# Patient Record
Sex: Female | Born: 1935 | Hispanic: No
Health system: Midwestern US, Academic
[De-identification: ages and names within clinical notes are randomized; demographics above are authoritative.]

---

## 2022-02-06 ENCOUNTER — Encounter: Admit: 2022-02-06 | Discharge: 2022-02-06 | Payer: MEDICARE

## 2022-02-07 ENCOUNTER — Encounter: Admit: 2022-02-07 | Discharge: 2022-02-07 | Payer: MEDICARE

## 2022-02-07 DIAGNOSIS — C50912 Malignant neoplasm of unspecified site of left female breast: Secondary | ICD-10-CM

## 2022-02-13 ENCOUNTER — Encounter: Admit: 2022-02-13 | Discharge: 2022-02-13 | Payer: MEDICARE

## 2022-02-13 DIAGNOSIS — C50912 Malignant neoplasm of unspecified site of left female breast: Secondary | ICD-10-CM

## 2022-02-13 DIAGNOSIS — C50919 Malignant neoplasm of unspecified site of unspecified female breast: Secondary | ICD-10-CM

## 2022-02-15 ENCOUNTER — Encounter: Admit: 2022-02-15 | Discharge: 2022-02-15 | Payer: MEDICARE

## 2022-02-15 DIAGNOSIS — C50919 Malignant neoplasm of unspecified site of unspecified female breast: Secondary | ICD-10-CM

## 2022-02-15 DIAGNOSIS — M1990 Unspecified osteoarthritis, unspecified site: Secondary | ICD-10-CM

## 2022-02-15 DIAGNOSIS — C50212 Malignant neoplasm of upper-inner quadrant of left female breast: Secondary | ICD-10-CM

## 2022-02-15 DIAGNOSIS — I4891 Unspecified atrial fibrillation: Secondary | ICD-10-CM

## 2022-02-15 DIAGNOSIS — I519 Heart disease, unspecified: Secondary | ICD-10-CM

## 2022-02-15 DIAGNOSIS — M549 Dorsalgia, unspecified: Secondary | ICD-10-CM

## 2022-02-15 DIAGNOSIS — Z9189 Other specified personal risk factors, not elsewhere classified: Secondary | ICD-10-CM

## 2022-02-15 NOTE — Progress Notes
Spoke with patient regarding surgery plan pending and confirmed surgery date of 5/18 with Dr. Earleen Newport. Consent was not obtained at this time. The patient was informed that they would receive a call from the surgery department the day prior to surgery to confirm time of arrival.  The patient was given detailed instructions about where to check in the day of surgery.     Paperwork explaining pre-operative instructions was given to the patient and reviewed in detail. Informed patient that we will see them post operatively 1-2 weeks after date of surgery.    The patient has been educated of NPO requirements starting at midnight the night before surgery. Also informed that a driver will need to accompany patient due to the influence of anesthesia including narcotics post procedure. Informed patient that the Anesthesia department will call to discuss lab work, medication review (will discuss medications that need to be stopped 7-10 days prior to surgery), health history, anesthesia/ surgical history, and any other additional recommended testing for clearance for surgery.      The patient verbalized understanding of the information given and was provided a phone number to call with any questions or concerns.      Shireen Quan, RN

## 2022-02-15 NOTE — Progress Notes
Lymphedema Prevention Bioimpedance Spectroscopy (BIS) Monitoring    BIS obtained for baseline measurements prior to surgery.    Baseline BIS = -3.8, Left  No notification indicated     Dominant hand: right handed  BMI: 24.06    A BIS test (Bioimpedance Spectroscopy test) was done in coordination with your breast surgery appointment today for baseline lymphedema evaluation only. You are not at risk for developing lymphedema until after surgery. The BIS test is a scale used to help detect early signs of lymphedema. Your baseline measurement from today will be used to aid with lymphedema monitoring in the future. Our clinic will meet with you for lymphedema education post-operatively and will explain this test in greater detail at that time.

## 2022-02-15 NOTE — Progress Notes
Name: Misty Clay          MRN: [id number]      DOB: 11-22-1932      AGE: 88 y.o.   DATE OF SERVICE: 02/15/2022                Reason for Visit:  New Patient      Misty Clay is a 88 y.o. female.      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 Misty Clay had no complaints prior to her outside screening mammogram on 12/25/21. At that time, there was a new left breast density. She returned for left diagnostic mammogram and ultrasound on 01/04/22. The mass persisted and was mildly suspicious on ultrasound and biopsy was recommended. Left breast sono-guided biopsy 01/24/22 ([place]) revealed grade 2 invasive ductal carcinoma.  She denies breast pain, nipple retraction, nipple discharge, or skin changes.    BREAST IMAGING:  Mammogram:    -- Bilateral screening mammogram 12/25/21 [facility]) revealed heterogeneously dense breast tissue. There is a new density in the medial aspect of the left breast best seen on the CC view. This appears to be just below the nipple line on the MLO view. Additional views are recommended. Right breast is unremarkable. There are scattered benign calcifications.  -- Left diagnostic mammogram 01/04/22 ([place]) revealed compression and rolled views of this area show that this finding still seems to persist. This area is difficult to identify on the true lateral view. It is unclear whether this finding is related to superimposition or to an underlying abnormality. I would recommend ultrasound.  -- Bilateral diagnostic mammogram 02/15/22 ([place]) revealed scattered areas of fibroglandular density. ?History: 88 year old female with recently diagnosed left breast malignancy at an external facility. 2-D and 3-D images of the right breast were performed, including 3-D spot compression views. Spot magnification views were obtained of the left breast. Right: The questioned asymmetry in the central posterior right breast on CC view does not persist on additional views. Left: There are loosely grouped faint and coarse heterogeneous calcifications in the upper outer quadrant at 2:00, far posterior depth, measuring up to 4 cm.     Ultrasound:    -- Targeted left breast ultrasound 01/04/22 ([place]) revealed at 9:00, 5 cm FTN, there is a 5 x 2 x 3 mm avascular hypoechoic area. This hypoechoic area is surrounded by an isoechoic rind which makes the entire region apprxoimately 1 cm in size. I am suspicious that this does correspond to the finding of the mammogram. There does not appear to be any significant vascularity associated with this finding that would suggest it is an aggressive neoplastic process. Even so, the possibility that this is neoplastic in nature should still be considered. Ultrasound biopsy recommended. BI-RADS 4.  -- Targeted left breast ultrasound 02/15/22 ([place]) revealed a 1.0 x 0.8 x 1.3 cm irregular hypoechoic mass with indistinct margins in the left breast at the 9:00, 5 cm from  the nipple consistent with biopsy-proven malignancy. Tissue marker clip is at the deep/posterior aspect. No morphologically abnormal left internal mammary lymph nodes are identified. 5 morphologically normal-appearing left axillary lymph nodes are documented. Impression: 1. Biopsy-proven malignancy in the left breast at 9:00 measuring up to 1.3 cm. 2. Mildly suspicious calcifications in the upper outer left breast. If breast conservation therapy is desired, stereotactic guided biopsy would be recommended. REPRODUCTIVE HEALTH:  Age at first Menarche:  89  Age at First Live Birth:  53  Age at Menopause:  S/p hysterectomy at 60, took HRT until 75s  Gravida:  2  Para: 2    PROCEDURE: pending  PERTINENT PMH:  Atrial fibrillation, HTN  FAMILY HISTORY:  No known family history of breast, ovarian, prostate or pancreatic cancer  PHYSICAL EXAM on PRESENTATION:    MEDICAL ONCOLOGY:   Dr. Lysle Morales   REFERRED BY:  Dr. Lysle Morales       Review of Systems    Constitutional: Negative for fever, chills. appetite change and fatigue.   HENT: Negative for hearing loss, congestion, rhinorrhea and tinnitus.    Eyes: Negative for pain, discharge and itching.   Respiratory: Negative for cough, chest tightness and shortness of breath.    Cardiovascular: Negative for chest pain and palpitations.   Gastrointestinal: Negative for abdominal distention, pain, nausea, vomiting, and diarrhea.   Genitourinary: Negative for frequency, vaginal bleeding, difficulty urinating and pelvic pain.   Musculoskeletal: Negative for myalgias, back pain, joint swelling and arthralgias.   Skin: Negative for rash.   Neurological: Negative for dizziness, weakness, light-headedness and headaches.   Hematological: Does not bruise/bleed easily.   Psychiatric/Behavioral: Negative for disturbed wake/sleep cycle. The patient is not nervous/anxious. confusion    No Known Allergies    Medical History:   Diagnosis Date   ? Arthritis    ? Atrial fibrillation (HCC)    ? Back pain    ? Breast cancer (HCC) 01/2022   ? Heart disease      Surgical History:   Procedure Laterality Date   ? ANGIOPLASTY     ? BREAST BIOPSY     ? CATARACT REMOVAL     ? COLONOSCOPY     ? HX HYSTERECTOMY       Family History   Problem Relation Age of Onset   ? Cancer Mother    ? Heart Disease Father    ? Heart Disease Maternal Aunt      Social History     Socioeconomic History   ? Marital status: Widowed   Tobacco Use   ? Smoking status: Former     Packs/day: 0.50     Years: 15.00     Pack years: 7.50 Types: Cigarettes     Quit date: 1973     Years since quitting: 50.3   ? Smokeless tobacco: Never   Substance and Sexual Activity   ? Alcohol use: Not Currently   ? Drug use: Never                   Objective:         ? acetaminophen (TYLENOL PO) Take  by mouth.   ? calcium carbonate/vitamin D3 (CALTRATE 600 + D PO) Take  by mouth.   ? famotidine (PEPCID) 20 mg tablet Take one tablet by mouth daily.   ? FEROSUL 325 mg (65 mg iron) tablet Take one tablet by mouth daily.   ? Gluc-Chon-MSM#1-C-Mang-Bos-Bor 750-625-30 mg tab Take 2 tablets by mouth daily.   ?  hydroCHLOROthiazide 12.5 mg capsule Take one capsule by mouth daily.   ? metoprolol succinate XL (TOPROL XL) 50 mg extended release tablet Take one tablet by mouth twice daily.   ? XARELTO 20 mg tablet Take one tablet by mouth daily.     Vitals:    02/15/22 1038 02/15/22 1039   BP:  (!) 164/70   BP Source:  Arm, Left Upper   Pulse:  55   Temp:  36.2 ?C (97.1 ?F)   Resp:  18   SpO2:  99%   O2 Device:  None (Room air)   TempSrc:  Temporal   PainSc: Three Three   Weight:  62.6 kg (138 lb)   Height:  161.3 cm (5' 3.5)     Body mass index is 24.06 kg/m?Marland Kitchen     Pain Score: Three  Pain Loc: Knee    Fatigue Scale: 8    Pain Addressed:  N/A    Patient Evaluated for a Clinical Trial: No treatment clinical trial available for this patient.     [place] Cooperative Oncology Group performance status is 1, Restricted in physically strenuous activity but ambulatory and able to carry out work of a light or sedentary nature, e.g., light house work, office work.     Physical Exam  Vitals reviewed.     RIGHT BREAST EXAM:  Breast: No discretely palpable masses, no nipple discharge, no nipple inversion, no skin changes  Skin Erythema:  No  Attachment of Overlying Skin:  No  Peau d' orange:  No  Chest Wall Attachment:  No  Nipple Inversion:  No  Nipple Discharge: No    LEFT BREAST EXAM:  Breast: Oval mass at 9:00 most likely post biopsy hematoma measuring 2.5 cm 3 cm from the border of the areola, 3 cm area of skin ecchymosis lying 1 cm below the palpable hematoma, skin pulling diagonally just below the hematoma in the seated flexed position.  No nipple changes.  No skin erythema.  Skin Erythema:  No  Attachment of Overlying Skin:  No  Peau d' orange:  No  Chest Wall Attachment: No  Nipple Inversion:  No  Nipple Discharge:  No    RIGHT NODAL BASIN EXAM:  Axillary:  negative  Infraclavicular:  negative  Supraclavicular:  negative    LEFT NODAL BASIN EXAM:  Axillary:  negative  Infraclavicular: negative  Supraclavicular:  negative      Constitutional: Well-developed and well-nourished. No acute distress.  HEENT:  Head: Normocephalic and atraumatic.  Eyes: Pupils are equal, round and reactive to light. No discharge. No scleral icterus.  Pulmonary/Chest: Effort normal. No respiratory distress.   Musculoskeletal: Normal range of motion. No edema.  Neurological: Alert and oriented to person, place and time. No cranial nerve deficit. has some memory loss  Skin: Warm and dry. No rash noted. No erythema. No pallor.  Psychiatric: Normal mood and affect. Behavior is normal. Judgement and thought content normal.            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 We discussed the need for a multidisciplinary approach to breast cancer treatment with local and systemic therapy. We viewed her breast imaging and results in detail.   The area of known malignancy measures 1.3 cm in greatest dimension on ultrasound today.  5 normal-appearing lymph nodes were also identified.  Additionally, faint calcifications were identified at 2:00 in the left breast measuring a total area of 4 cm.  We discussed the need to perform a biopsy of the calcifications to determine if this is a second site of malignancy.  She expressed her understanding and is ready to proceed with the biopsy.  We then discussed the sequence of treatment for local and systemic therapy.  Based on the early stage and tumor biology would recommend proceeding with surgery first.  This will allow [place] to obtain additional information for finalizing the systemic therapy and radiation therapy plans.    We then discussed local treatment options in detail. Discussed options of BCT versus mastectomy. Similar risk of local/regional recurrence with BCT and mastectomy, with no survival benefit for mastectomy.  If the biopsy is negative for malignancy she is an excellent candidate for lumpectomy which would be her preference.  However, if the second area is malignancy we will then need to further explore the appropriateness of 2 site lumpectomy based on the final pathology versus needing to proceed with total mastectomy. Lumpectomy was discussed in detail including the use of local tissue rearrangement to improve cosmesis and reduce seroma formation.  Mastectomy with and without reconstruction was also discussed to include paresthesia, removal of the nipple areolar complex, and reconstruction as a phased process.  We also reviewed in terms of reconstruction the need for multiple surgeries and the impact of anesthesia on potentially worsening dementia.  If she needs mastectomy she is not interested in reconstruction and would prefer to proceed with total mastectomy.    We then discussed axillary nodal management. We also discussed the need for SLNB to evaluate for nodal metastasis and and pathologic staging. We discussed the risk of lymphedema associated with nodal surgery.  A baseline measurement with the BIS was obtained today.  She will meet with a lymphedema nurse in the Lymphedema Clinic for education.    Misty Clay follows with Dr. Henry Russel for cardiology. She is on Xarelto for atrial fibrillation. We will reach out to his office for cardiac clearance and to discuss stopping Xarelto for surgery.. Both Misty Clay and her daughter would like all communication to go through her daughter. We have updated the telephone number to reflect her daughter's number and messages can be left. Misty Clay is to return to the clinic 1-2 weeks post op. She and her daughter were given ample time to ask questions all of which were answered to their satisfaction.  They expressed their understanding of her conversation and the recommendations outlined above.  We will call them with the results of the biopsy and continue to finalize the surgical plan.  She was encouraged to call with any interval questions or concerns.    Based on the Cures Act all results will be immediately released to MyChart.  We will be completing a thorough review of the additional information and putting it in context with the overall diagnosis.  We will call once this has been completed to discuss the results, as well as the next steps in treatment.       1. Cardiac clearance  2. Continue follow up with Dr. Lysle Morales  3. Left stereotactic biopsy  4. Surgery plan pending biopsy    Guy Begin, PA-C    ATTESTATION    I personally interviewed and examined the patient.  I have reviewed the history outlined by the Physician Assistant.  I personally documented additional history, review of systems, the entire physical exam, and the  assessment and plan above.      Staff name:  Neldon Mc, DO Date:  02/15/2022

## 2022-02-15 NOTE — Progress Notes
Bioimpedance Spectroscopy performed. Advised patient that Normal result will be sent within 24 hours by mail or via Mychart (preferred). The patient will be contacted via phone by the lymphedema nurse with any abnormal results.

## 2022-02-15 NOTE — Patient Instructions
BREAST SURGERY POST-OPERATIVE INSTRUCTIONS    DIET  -You have no dietary restrictions. Please continue with a healthy balanced diet.  -Do not drink alcohol while taking narcotic pain medication.    ACTIVITY  -No driving while taking narcotic pain medication.  -No lifting greater than 10 pounds.   -No repetitive motions with the arm on the side of surgery (ie. laundry, vacuuming, washing windows/floors, etc.)  -Begin the exercises found in your treatment guide 24 hours after surgery.  -You may resume your normal activity once cleared by your surgeon. This can be discussed at your post-operative appointment.    INCISION CARE  -Keep your incision clean and dry.    -You may shower 24 hours following your procedure. It is safe for the incisions to get wet unless otherwise instructed by your surgeon. Pat incisions dry after showering.  -Avoid applying deodorants, powders, creams, lotions, etc. to your incision for 4 weeks.  -Usually, there are no stitches to be removed. You will either have a Dermabond (skin glue) or Therabond (silver covering) dressing over your incision.    *Dermabond is an antimicrobial barrier that will begin to flake after approximately 1 week. Do not peel to remove prior to flaking. Do NOT apply ointments to your incisions over the Dermabond, as these substances can weaken the barrier and allow for skin edge separation.    *Therabond is a silver impregnated dressing that promotes wound healing. This dressing is to stay in place until your surgeon instructs you to remove it.  -Your incision should gradually look better each day.  If you notice unusual swelling, redness, drainage, have increasing pain at the site, or have a fever greater than 100 degrees, notify your physician immediately.  -You may have a pink post-surgery bra and fluffs in place.  This can be removed 24 hours after surgery to shower. Continue to wear the pink bra or a snug sports bra until your post-operative appointment. Do NOT wear underwire bras.    SIGNS & SYMPTOMS  -Please contact your doctor if you have any of the following symptoms: temperature higher than 100 degrees F, uncontrolled pain, persistent nausea and/or vomiting, difficulty breathing or breast redness, leakage or incision breakdown/opening.     IF YOU HAVE A DRAIN  *WASH HANDS PRIOR TO ANY HANDLING OF DRAIN.   -Please write down daily (total for 24 hours) drain output and empty multiple times a day if needed or according to physician's instructions. Record the output in milliliters.  -There is a CHG drain dressing in place over the entry site of your drain. This is to remain in place until removed by your surgeon. There is an antimicrobial gel in the center rectangle of the dressing. This is normal and not a sign of fluid leaking. If there is physical liquid leaking on the skin outside the area of the drain, please notify your surgeons office.   -To empty the drain, open the lid on top of the bulb.  Pour the fluid into a measuring cup and record.  Close by squeezing the bulb until as much air as possible is removed, then cap the lid to recreate suction.   -Strip the drain twice daily or as needed by pinching the tubing near the skin with both hands.  Keep hand closest to you steady and with the other, slowly squeeze the liquid toward the bulb.  This will prevent any clots or debris from clogging the drain.   -It is okay to shower with the drain  and dressing in place.  Pat the dressing dry after showering.    *When showering consider placing your back to the water and letting the water run over the surgical areas. Soap and water contact over the surgical sites is OK. Please do not soak or scrub the sites.     *A lanyard can be used to hook the drains around your neck to allow freedom of hands for showering.    -Please bring the drain record to your next clinic appointment.   -Secure drains to your post-operative bra or use a safety pin to pin to clothing. Do not let drains dangle without support.   -DO NOT TRY TO PUSH THE DRAIN BACK IN IF IT GETS PULLED OUT (even a little bit), AS THIS IS A BIG INFECTION RISK.  If you have any problems with the drain (ex. large amounts of fluid, very bloody fluid, or loss of suction), please call your doctor.    MEDICATIONS  -Do not exceed 4000mg  of Tylenol (acetaminophen) in a 24-hour period. The pain medication prescribed may have a narcotic and Tylenol together. Please verify this on your medication bottle prior to taking additional Tylenol.    OPIOID (NARCOTIC) PAIN MEDICATION SAFETY  -We care about your comfort and believe you may need opioid medications at this time to treat your pain.  An opioid is a strong pain medication.  It is only available by prescription for moderate to severe pain.  Usually, these medications are used for only a short time to treat pain.    -When used the right way, opioids are safe and effective medications to treat your pain.  Yet, when used in the wrong way, opioids can be dangerous for you or others.  Opioids do not work for everyone.  Most patients do not get full relief of their pain from opioid medication; full relief of your pain may not be possible.    -For your safety, we ask you to follow these instructions:  *Only take your opioid medication as prescribed.  If your pain is not controlled with the prescribed dose or the medication is not lasting long enough, call your doctor.  *Do not break or crush your opioid medication unless your doctor or pharmacist says you can.  With certain medications this can be dangerous and may cause death.  *Never share your medication with others even if they appear to have a good reason.  Never take someone else?s pain medication - this is dangerous and illegal.  Overdoses and deaths have occurred.  *Keep your opioid medications safe, as you would with cash, in a lock box or similar container.  *Make sure your opioids are going to be secure, especially if you are around children or teens.   *Talk with your doctor or pharmacist if you have questions about taking other medications.   *Avoid driving, operative machinery or drinking alcohol while taking opioid pain medication.  This may be unsafe.     CONSTIPATION PREVENTION  -Most patients require narcotic pain medication following surgery and these medications frequently cause significant constipation. Constipation can also occur post-operatively due to anesthesia, inactivity and decrease in oral intake. Constipation can cause discomfort, bloating, nausea and vomiting. Below are recommendations on how to treat and prevent constipation if you experience this following surgery.    -Start a daily laxative regimen as soon as you start any narcotic pain medication or see any deviation from your normal bowel pattern. You will be given a prescription for Senna-S after  surgery. If you stay overnight after your surgery, a laxative will be started while you are in the hospital.      -Medications that can help with constipation:    *Senna-S    *Miralax, 1-2 capfuls daily    *Fiber supplements (Fibercon or Benefiber). Follow manufacturer dosing recommendations.                                          You can also consider dietary fiber options such as prunes or prune juice.    *Probiotics (Bio-K+, Culturelle, Align or Florastor), 1-2 daily. Do not take a probiotic if you are on chemotherapy unless you have received permission from your oncologist.     *Milk of Magnesia 1,2000mg /47mL, 2-4 doses daily. Do not take if you have kidney issues or are on dialysis.     -If you are still having trouble with constipation despite using the above medications for 1-2 days without a bowel movement or have significant discomfort from constipation you may try the below over the counter option. Do not go longer than 2 days without a bowel movement before you try taking additional laxatives.    *Magnesium Citrate (1-2 bottles in 24 hour period). Do not take if you have kidney issues or are on dialysis.       For questions or concerns regarding your hospital stay:  - DURING BUSINESS HOURS (8:00 AM - 4:30 PM):    Call [phone] to speak to your surgeon?s nurse    - AFTER BUSINESS HOURS (4:30 PM - 8:00 AM, on weekends, or holidays):  Call [phone] and ask to page the BREAST SURGERY team      IF A PLASTIC SURGEON WAS INVOLVED IN YOUR SURGERY:    - DURING BUSINESS HOURS (8:00 AM - 4:30 PM):    Call the office at [phone]    - AFTER BUSINESS HOURS (4:30 PM - 8:00 AM, on weekends, or holidays):  Call [phone] and ask to page the PLASTIC SURGERY team    *If you had reconstruction with a plastic surgeon, all pain medication refills will be completed by their office.      Post-Operative Exercises after Breast Surgery      Once your breast cancer surgery is complete, you should immediately begin performing simple exercises.  These exercises are safe to do even if you have drains in place and are designed to promote circulations, keep your muscles strong, reduce stiffness in your shoulder and reduce scar tissue.  Your chest and underarm may feel tight after your surgery, but this is normal and should decrease once you begin the exercises.  Sore or numb feelings in the chest wall or back of the arm are also normal, but call if you experience any swelling or notice any redness or tenderness.    Note:  If you underwent reconstructive surgery, talk to your surgery team about when to begin these exercises.        Exercise 1.    In this exercise, you will bend your arm at the elbow.  Then, you will extend your arm back straight.    Lie down and extend your arm (on the surgery side) straight to your side.  Keep your palm facing forward.  Bend your arm at the elbow.  Extend your arm back straight, fully stretching it out.  Repeat five times, three sets per day.  Exercise 2.  During this exercise, you will rotate your shoulders slowly in a backward motion, then in a forward motion.    Lie down and relax your shoulders.  Rotate your shoulders slowly in a backward motion.  Reverse the motion and slowly rotate them forward.  Repeat five times, three sets per day.          Exercise 3.  This exercise has you squeeze your shoulder blades together.    Lie down face up and keep your arms at your sides.  Squeeze your shoulder blades together.  Hold this position for five seconds.  Repeat five times, three sets per day.         Exercise 4.  In this exercise, you will extend your forearms out, forming right angles at your elbows.    Lie down face up on the bed, and keep your arms to your sides.  Extend your forearms out, forming right angles at your elbows.  Turn your forearms out so the backs of your hands face the bed.  Bring your forearms back in and bring your palms towards your abdomen.  Repeat five times, three sets per day.         Exercise 5.  This exercise has you raising your forearms to form a right angle at your elbow, then pushing your elbow down against the bed.    Lie down face up on the bed and hold your arm next to your side.  Raise your forearm to form a right angle at your elbow.   Push your elbow down against the bed.  Repeat five times, three sets per day.        Exercise 6.  In this exercise, you will push your arm against the wall and then pull it tight back to your side.    Stand with your side against a wall.  Keep your upper arm next to your side and bring your forearm up to form a right angle at your elbow.  Push your arm against the wall and then pull it tight back to your side.  Repeat five times, three sets per day.                      POST-OPERATIVE EXERCISES WITHOUT DRAINS    If you don?t have drains - or if your drains have been removed -  you can now begin this set of exercises. These are designed to improve your range of motion and strengthen your arm and shoulder.  Be sure to complete the exercises carefully to achieve a slow stretch, and avoid bouncing or jerking your arm.  Full range of motion should return within one to two months following your surgery, so if you continue to have trouble completing your normal activities (such as dressing, grooming, etc.) talk to your doctor, who may refer you to a physical therapist.      Exercise 1.  In this exercise, you will rotate your shoulders slowly in a backward motion,  then in a forward motion.    Lie down face up and relax your shoulders.  Rotate your shoulders slowly in a backward motion.  Reverse the motion and slowly rotate them forward.  Repeat five times, three sets per day.      Exercise 2. This exercise has you squeeze your shoulder blades together.    Lie down face up and keep your arms at your sides.  Squeeze your shoulder blades together.  Hold this  position for five seconds.  Repeat five times, three sets per day.       Exercise 3.  During this exercise, you will slowly pull your elbow and arm across your chest.    Lie down face up and grab the elbow that?s on the side of your surgery with your other hand.  Slowly pull your elbow and arm across your chest.  You should feel a stretch.  Hold this position for five seconds.  Repeat five times, three sets per day      Exercise 4.  Here, you will raise your arm (on your surgery side) over your head as far as you can reach.    Lie down face up and raise your arm (on your surgery side) over your head as far as you can reach.  Keep your thumb down and your elbow straight.  Hold this position for five seconds and then slowly return your arm to the original position.  Repeat five times, three sets per day      Exercise 5.  This exercise stretches across your chest as you slowly lower your elbows down to the sides.    Lie down face up and place your hands behind your neck.  Point your elbows up.  Slowly lower your elbows down to touch the bed.  Repeat five times, three sets per day       Exercise 6.  You will use a small, rolled towel during this exercise.    Lie down on your back on a small, rolled towel (about 2-3 inches thick) placed between your shoulder blades.  Remain in this position on the towel for 5 minutes.  Repeat three times each day       Exercise 7.  You should feel a stretch in your shoulder as you gently pull your arm (on the surgery side) behind you.    Stand and reach behind you with the arm oth the surgery side.  Grasp that arm with your other hand.  Gently pull your arm up.  You should feel a stretch in your shoulder.  Hold this position for five seconds.  Repeat five times, three sets per day       Exercise 8.  This stretch requires the use of a wall to stretch your shoulders.    Stand in a corner 1 to 2 feet away from the wall.  Place your hands on the wall.  Lean in to gently stretch your chest and your shoulders on the surgery side.  Vary the exercise by placing your hands higher or lower on the wall, or by stepping back from the corner slightly.  Hold the stretch for 30 seconds.  Repeat five times, three sets per day       EXAMPLES OF POSTOPERATIVE BRAS    -You will go home from surgery with a pink bra in place.     -This is only a guideline with examples of other appropriate postoperative bras.  You do not need to purchase these unless you would like additional bras to wear.  [facility] does not profit from any of these bras  - Wear non-under wire bras for 4 weeks.   - Sports bras that snap or zip in front are usually the easiest after breast surgery.     [employer]:  women?s power shape max support front closure   Price: $24.99 + tax       -    [employer]: [employer] Comfort  front close       Price: $7-$8 + tax       [facility]:  [employer] Bra [phone]  Samuella Cota [phone] + tax          [employer] Armour- ARMOUR eclipse high impact zip bra   Higher quality.  Will last forever if you hang up to dry.  Has best support for exercise.  Online or in stores: Under [employer]our zip front sports bra  Can find at [employer], sports stores, some [place], or online.    Price varies: $59.99 and up + tax          -     [employer]: [employer]'s Easy Does It No Dig Wire-Free Bra - [id number]        Price: $19.99 - $38 + Tax

## 2022-02-16 ENCOUNTER — Encounter: Admit: 2022-02-16 | Discharge: 2022-02-16 | Payer: MEDICARE

## 2022-02-16 NOTE — Telephone Encounter
Phoned Misty Clay to inform her that she needs to hold her Xarelto for 3 days before her stereotactic biopsy scheduled for 4/28.  Informed her that I verified this information with our [place].  I informed her that I have also spoken with her daughter Misty Clay and given her this information as well.     Misty Clay verbalizes understanding, and has no additional questions at this time. Encouraged her to call our office if any questions or concerns arise.

## 2022-02-16 NOTE — Telephone Encounter
Phoned Misty Clay's daughter Misty Clay to notify her that Misty Clay will need to hold her Xarelto for 3 days prior to her stereotactic biopsy scheduled for 4/28.      Misty Clay verbalizes understanding and requests I also call the patient with this information as well.

## 2022-02-19 NOTE — Progress Notes
I spoke with Angela Nevin, patients daughter regarding a breast biopsy scheduled at the [facility] location on 02/23/2022 at 1:00pm. She was instructed to check-in at 12:30, a driver is not required, wear comfortable clothing and supportive bra and I encouraged her to eat before arriving. She was educated in detail what to expect before, during and after her procedure. Angela Nevin verified that her mother takes Xarelto and I discussed with her there is no need to hold this medication prior to this procedure. Her questions were answered and she verbalized understanding of the information I gave her over the phone today. I also spoke with Hoyle Sauer to let her know what she should expect before during and after her procedure and she can take her Xarelto as scheduled prior to biopsy.

## 2022-02-20 ENCOUNTER — Encounter: Admit: 2022-02-20 | Discharge: 2022-02-20 | Payer: MEDICARE

## 2022-02-20 DIAGNOSIS — C50212 Malignant neoplasm of upper-inner quadrant of left female breast: Secondary | ICD-10-CM

## 2022-02-21 ENCOUNTER — Encounter: Admit: 2022-02-21 | Discharge: 2022-02-21 | Payer: MEDICARE

## 2022-02-21 ENCOUNTER — Ambulatory Visit: Admit: 2022-02-21 | Discharge: 2022-02-21 | Payer: MEDICARE

## 2022-02-21 DIAGNOSIS — C50212 Malignant neoplasm of upper-inner quadrant of left female breast: Secondary | ICD-10-CM

## 2022-02-21 MED ORDER — CEFAZOLIN INJ 1GM IVP
2 g | Freq: Once | INTRAVENOUS | 0 refills
Start: 2022-02-21 — End: ?

## 2022-02-23 ENCOUNTER — Encounter: Admit: 2022-02-23 | Discharge: 2022-02-23 | Payer: MEDICARE

## 2022-02-23 DIAGNOSIS — C50212 Malignant neoplasm of upper-inner quadrant of left female breast: Secondary | ICD-10-CM

## 2022-02-23 MED ORDER — LIDOCAINE 1%-EPINEPHRINE 1:100000 (BUFFERED) VIAL (BREAST CENTER)
7 mL | Freq: Once | INTRAMUSCULAR | 0 refills | Status: CP
Start: 2022-02-23 — End: ?

## 2022-02-23 MED ORDER — LIDOCAINE 1% (BUFFERED) SYRINGE (BREAST CENTER)
1 mL | Freq: Once | INTRAMUSCULAR | 0 refills | Status: CP
Start: 2022-02-23 — End: ?

## 2022-02-23 MED ORDER — LIDOCAINE-EPINEPHRINE 1 %-1:100,000 IJ SOLN
10 mL | Freq: Once | INTRAMUSCULAR | 0 refills | Status: CP
Start: 2022-02-23 — End: ?
  Administered 2022-02-23: 18:00:00 7 mL via INTRAMUSCULAR

## 2022-02-23 NOTE — Progress Notes
Misty Clay is here for a Misty Clay today. She has been educated, risks of procedure were discussed and her questions answered prior to signing procedure consent with Dr.  and this nurse present

## 2022-02-26 ENCOUNTER — Encounter: Admit: 2022-02-26 | Discharge: 2022-02-26 | Payer: MEDICARE

## 2022-02-26 DIAGNOSIS — M1990 Unspecified osteoarthritis, unspecified site: Secondary | ICD-10-CM

## 2022-02-26 DIAGNOSIS — I10 Essential (primary) hypertension: Secondary | ICD-10-CM

## 2022-02-26 DIAGNOSIS — I4891 Unspecified atrial fibrillation: Secondary | ICD-10-CM

## 2022-02-26 DIAGNOSIS — I519 Heart disease, unspecified: Secondary | ICD-10-CM

## 2022-02-26 DIAGNOSIS — M549 Dorsalgia, unspecified: Secondary | ICD-10-CM

## 2022-02-26 DIAGNOSIS — C50919 Malignant neoplasm of unspecified site of unspecified female breast: Secondary | ICD-10-CM

## 2022-03-01 ENCOUNTER — Encounter: Admit: 2022-03-01 | Discharge: 2022-03-01 | Payer: MEDICARE

## 2022-03-01 NOTE — Telephone Encounter
Phone call to patient's daughter, Misty Clay, to review biopsy results. Message left with results. Informed Misty Clay that [name] does not need a mastectomy at this time. Explained that we will discuss her pathology next Wednesday at our [place] and contact them with final surgical plan regarding a one or two site lumpectomy with Dr Earleen Newport on 05/18. My contact information left and encouraged them to call with any additional questions or concerns.

## 2022-03-06 ENCOUNTER — Encounter: Admit: 2022-03-06 | Discharge: 2022-03-06 | Payer: MEDICARE

## 2022-03-07 ENCOUNTER — Encounter: Admit: 2022-03-07 | Discharge: 2022-03-07 | Payer: MEDICARE

## 2022-03-07 DIAGNOSIS — C50212 Malignant neoplasm of upper-inner quadrant of left female breast: Secondary | ICD-10-CM

## 2022-03-07 NOTE — Telephone Encounter
Phone call to patient's daughter, Angela Nevin and message left with follow up recommendations from [place]. Explained that we are not recommending excision of this additional area and can proceed with Left RSL Lumpectomy, SLNB as previously discussed with Dr Earleen Newport on 05/18. Informed Angela Nevin that breast imaging will contact her to arrange seed placement for [facility]. Surgery Guide will be mailed to home address. My contact information provided and encouraged them to call with any additional questions/concerns.

## 2022-03-12 ENCOUNTER — Encounter: Admit: 2022-03-12 | Discharge: 2022-03-12 | Payer: MEDICARE

## 2022-03-12 NOTE — Progress Notes
I spoke with Misty Clay this afternoon on the phone in regards to her mother's scheduled Breast Radioactive Seed Localization procedure on 03/14/2022 at 1000 in [facility] (her mothers full name and date of birth were verified).   She was educated on how to prepare for the Radioactive Seed Localization, and what to expect during and after the procedure.   She stated that her surgery date is 03/15/2022.   I reviewed with her that she needed to be at Breast [facility] 1 hour prior to the appointment time.  She verbalized understanding in regards to all instructions and did not have any questions.

## 2022-03-13 ENCOUNTER — Encounter: Admit: 2022-03-13 | Discharge: 2022-03-13 | Payer: MEDICARE

## 2022-03-13 DIAGNOSIS — C50212 Malignant neoplasm of upper-inner quadrant of left female breast: Secondary | ICD-10-CM

## 2022-03-14 ENCOUNTER — Encounter: Admit: 2022-03-14 | Discharge: 2022-03-14 | Payer: MEDICARE

## 2022-03-14 DIAGNOSIS — C50212 Malignant neoplasm of upper-inner quadrant of left female breast: Secondary | ICD-10-CM

## 2022-03-14 MED ORDER — LIDOCAINE 1% (BUFFERED) SYRINGE (BREAST CENTER)
2 mL | Freq: Once | INTRAMUSCULAR | 0 refills | Status: CP
Start: 2022-03-14 — End: ?

## 2022-03-14 NOTE — Progress Notes
Misty Clay is here for a left breast radioactive seed placement today. She has been educated, risk of procedure were discussed and her questions answered prior to signing procedure consent with Dr. Marcello Moores and this nurse present.

## 2022-03-15 ENCOUNTER — Ambulatory Visit: Admit: 2022-03-15 | Discharge: 2022-03-15 | Payer: MEDICARE

## 2022-03-15 ENCOUNTER — Encounter: Admit: 2022-03-15 | Discharge: 2022-03-15 | Payer: MEDICARE

## 2022-03-15 DIAGNOSIS — C50212 Malignant neoplasm of upper-inner quadrant of left female breast: Secondary | ICD-10-CM

## 2022-03-15 DIAGNOSIS — I10 Essential (primary) hypertension: Secondary | ICD-10-CM

## 2022-03-15 DIAGNOSIS — M549 Dorsalgia, unspecified: Secondary | ICD-10-CM

## 2022-03-15 DIAGNOSIS — I4891 Unspecified atrial fibrillation: Secondary | ICD-10-CM

## 2022-03-15 DIAGNOSIS — M1990 Unspecified osteoarthritis, unspecified site: Secondary | ICD-10-CM

## 2022-03-15 DIAGNOSIS — I519 Heart disease, unspecified: Secondary | ICD-10-CM

## 2022-03-15 DIAGNOSIS — C50919 Malignant neoplasm of unspecified site of unspecified female breast: Secondary | ICD-10-CM

## 2022-03-15 MED ORDER — PROPOFOL INJ 10 MG/ML IV VIAL
INTRAVENOUS | 0 refills | Status: DC
Start: 2022-03-15 — End: 2022-03-15
  Administered 2022-03-15: 16:00:00 100 mg via INTRAVENOUS

## 2022-03-15 MED ORDER — PHENYLEPHRINE HCL IN 0.9% NACL 1 MG/10 ML (100 MCG/ML) IV SYRG
INTRAVENOUS | 0 refills | Status: DC
Start: 2022-03-15 — End: 2022-03-15
  Administered 2022-03-15: 17:00:00 100 ug via INTRAVENOUS
  Administered 2022-03-15: 17:00:00 150 ug via INTRAVENOUS
  Administered 2022-03-15 (×2): 100 ug via INTRAVENOUS

## 2022-03-15 MED ORDER — LIDOCAINE (PF) 200 MG/10 ML (2 %) IJ SYRG
INTRAVENOUS | 0 refills | Status: DC
Start: 2022-03-15 — End: 2022-03-15
  Administered 2022-03-15: 16:00:00 60 mg via INTRAVENOUS

## 2022-03-15 MED ORDER — ARTIFICIAL TEARS (PF) SINGLE DOSE DROPS GROUP
OPHTHALMIC | 0 refills | Status: DC
Start: 2022-03-15 — End: 2022-03-15
  Administered 2022-03-15: 16:00:00 2 [drp] via OPHTHALMIC

## 2022-03-15 MED ORDER — ONDANSETRON HCL (PF) 4 MG/2 ML IJ SOLN
INTRAVENOUS | 0 refills | Status: DC
Start: 2022-03-15 — End: 2022-03-15
  Administered 2022-03-15: 18:00:00 4 mg via INTRAVENOUS

## 2022-03-15 MED ORDER — PROPOFOL 10 MG/ML IV EMUL 50 ML (INFUSION)(AM)(OR)
INTRAVENOUS | 0 refills | Status: DC
Start: 2022-03-15 — End: 2022-03-15
  Administered 2022-03-15: 16:00:00 175 ug/kg/min via INTRAVENOUS

## 2022-03-15 MED ORDER — RP DX TC-99M SULF COLL MCI
.5 | Freq: Once | INTRAVENOUS | 0 refills | Status: CP
Start: 2022-03-15 — End: ?
  Administered 2022-03-15: 15:00:00 0.5 via INTRAVENOUS

## 2022-03-15 MED ORDER — DEXAMETHASONE SODIUM PHOSPHATE 4 MG/ML IJ SOLN
INTRAVENOUS | 0 refills | Status: DC
Start: 2022-03-15 — End: 2022-03-15
  Administered 2022-03-15: 16:00:00 4 mg via INTRAVENOUS

## 2022-03-15 MED ORDER — FENTANYL CITRATE (PF) 50 MCG/ML IJ SOLN
INTRAVENOUS | 0 refills | Status: DC
Start: 2022-03-15 — End: 2022-03-15
  Administered 2022-03-15 (×4): 25 ug via INTRAVENOUS

## 2022-03-15 MED ADMIN — FENTANYL CITRATE (PF) 50 MCG/ML IJ SOLN [3037]: 25 ug | INTRAVENOUS | @ 18:00:00 | Stop: 2022-03-15 | NDC 00641602701

## 2022-03-15 MED ADMIN — OXYCODONE 5 MG PO TAB [10814]: 5 mg | ORAL | @ 16:00:00 | Stop: 2022-03-15 | NDC 00406055223

## 2022-03-15 MED ADMIN — CEFAZOLIN INJ 1GM IVP [210319]: 2 g | INTRAVENOUS | @ 16:00:00 | Stop: 2022-03-15 | NDC 60505614200

## 2022-03-15 MED ADMIN — LACTATED RINGERS IV SOLP [4318]: 1000.000 mL | INTRAVENOUS | @ 14:00:00 | Stop: 2022-03-15 | NDC 00338011704

## 2022-03-15 MED ADMIN — ACETAMINOPHEN 500 MG PO TAB [102]: 1000 mg | ORAL | @ 16:00:00 | Stop: 2022-03-15 | NDC 00904673061

## 2022-03-15 MED ADMIN — OXYCODONE 5 MG PO TAB [10814]: 5 mg | ORAL | @ 18:00:00 | Stop: 2022-03-15 | NDC 00406055223

## 2022-03-15 MED ADMIN — FENTANYL CITRATE (PF) 50 MCG/ML IJ SOLN [3037]: 25 ug | INTRAVENOUS | @ 19:00:00 | Stop: 2022-03-15 | NDC 00641602701

## 2022-03-15 MED FILL — OXYCODONE 5 MG PO TAB: 5 mg | ORAL | 2 days supply | Qty: 10 | Fill #1 | Status: CP

## 2022-03-17 ENCOUNTER — Encounter: Admit: 2022-03-17 | Discharge: 2022-03-17 | Payer: MEDICARE

## 2022-03-17 DIAGNOSIS — I4891 Unspecified atrial fibrillation: Secondary | ICD-10-CM

## 2022-03-17 DIAGNOSIS — C50919 Malignant neoplasm of unspecified site of unspecified female breast: Secondary | ICD-10-CM

## 2022-03-17 DIAGNOSIS — M549 Dorsalgia, unspecified: Secondary | ICD-10-CM

## 2022-03-17 DIAGNOSIS — I519 Heart disease, unspecified: Secondary | ICD-10-CM

## 2022-03-17 DIAGNOSIS — I10 Essential (primary) hypertension: Secondary | ICD-10-CM

## 2022-03-17 DIAGNOSIS — M1990 Unspecified osteoarthritis, unspecified site: Secondary | ICD-10-CM

## 2022-03-20 ENCOUNTER — Encounter: Admit: 2022-03-20 | Discharge: 2022-03-20 | Payer: MEDICARE

## 2022-03-20 NOTE — Telephone Encounter
Voicemail received from patient's daughter, Angela Nevin asking to clarify/confirm her mother's 05/30 post-op appointments.     Phone call to patient's daughter, Angela Nevin and message left with appointment details for 05/30 with Dr Earleen Newport and [facility]. My contact information provided and encouraged them to call with any additional questions/concerns.

## 2022-03-27 ENCOUNTER — Encounter: Admit: 2022-03-27 | Discharge: 2022-03-27 | Payer: MEDICARE

## 2022-03-27 DIAGNOSIS — C50212 Malignant neoplasm of upper-inner quadrant of left female breast: Secondary | ICD-10-CM

## 2022-03-27 DIAGNOSIS — M1990 Unspecified osteoarthritis, unspecified site: Secondary | ICD-10-CM

## 2022-03-27 DIAGNOSIS — I4891 Unspecified atrial fibrillation: Secondary | ICD-10-CM

## 2022-03-27 DIAGNOSIS — I10 Essential (primary) hypertension: Secondary | ICD-10-CM

## 2022-03-27 DIAGNOSIS — M549 Dorsalgia, unspecified: Secondary | ICD-10-CM

## 2022-03-27 DIAGNOSIS — I519 Heart disease, unspecified: Secondary | ICD-10-CM

## 2022-03-27 DIAGNOSIS — C50919 Malignant neoplasm of unspecified site of unspecified female breast: Secondary | ICD-10-CM

## 2022-03-27 NOTE — Progress Notes
Lymphedema Post-Operative Education     DATE:  03/27/2022    PATIENT NAME:   Misty Clay  DATE OF BIRTH:   [birth year]  MRN:   [id number]    [name] presents today for post-operative lymphedema education.     Medical Team:   Surgeon: Loreta Ave  Medical oncologist: Dr. Lenise Arena  Radiation oncologist: Dr. Lysle Morales    Surgical Procedure: left RSL lumpectomy/SLNB on 03/15/22    BASELINE MEASUREMENTS  BIS = -3.8, Left   Hand Dominance:  right handed    Risk factors for the development of breast cancer treatment related lymphedema include:   1. Left lumpectomy  2. Left SLNB (0/3)  3. Radiation therapy- pending  4. Chemotherapy- pending   5. Other medical comorbidity: Atrial fibrillation, HTN    Lymphedema evaluation, teaching and care plan:     1. General review of lymphedema pathophysiology in breast cancer patients completed. No barriers to learning noted, patient willing and active participant. Very interested in learning about topic. Thoughtful questions demonstrated good insight into etiology and prevention.      2. Baseline L-Dex Bioimpedance test (SOZO) completed on 02/15/22.    3. Signs and symptoms to watch for once initial symptoms from surgery have resolved reviewed including but not limited to swelling, aching, heaviness, fullness in at risk area. Symptoms related to cellulitis or DVT should be reported urgently to surgery or medical oncology team.     4. Risk Factors/Precautions:   ? Obesity/weight gain (BMI greater than 30). Optimal weight management is ideal for overall health. Exercise without restrictions may be resumed when approved by surgeon.   ? Cancer and cancer treatments including surgery, radiation therapy, and certain types of chemotherapy are risk factors for developing lymphedema.   ? Precautions regarding meticulous skin care reviewed including keeping skin moisturized, protecting skin from burns and injuries, and reporting signs of infection.  Avoidance of blood pressures and needlesticks including medication injections, finger sticks, lab draws, and tattoos on affected arm discussed.   ? Early intervention can often reverse or prevent further swelling. Interventions may include use of compression sleeve, self-massage, and stretching exercises.    5. ROM stretching exercises reduce stiffness, reduce scar tissue, and promote lymphatic fluid movement. ROM stretching exercises are safe to start now. Post breast surgery stretching exercises in surgical guidebook encouraged.    6. When to call the office: Unrelenting aching, heaviness, fullness or swelling in arm or upper chest on the affected side. Signs and symptoms related to cellulitis or DVT should be reported urgently to the surgery or medical oncology team.      7. Lymphedema surveillance: BIS surveillance to be coordinated with routine surgical follow-ups.     Written handouts regarding covered material provided at time of today's visit, including contact information should questions or concerns arise.     Total time 25 minutes. Estimated counseling time 20 minutes regarding signs symptoms, risk factors, monitoring, when to contact us and follow up plan. Patient agreeable to follow up plan.

## 2022-03-27 NOTE — Progress Notes
HPI:  12 days s/p left lumpectomy/SLNB for left IDC. Misty Clay reports she is doing well. She has noted swelling in the left axilla and has a seroma/hematoma. She also has some pulling at the lumpectomy incision.    PHYSICAL EXAM:    Breast:  left    Incision:  c/d/i; hematoma present    Erythema:  No    Ecchymosis:  No    Seroma:  No  Axilla:    Incision:  c/d/i    Erythema:  No    Ecchymosis:  No    Seroma:  Yes unsuccessful aspiration    Upper Extremity:    ROM:  Good    Winged scapula: No    Lymphedema:  No    PATHOLOGY:    Final Diagnosis:     A. Lymph node (1), left sentinel lymph node #1, excision:   There is no evidence of malignancy in one lymph node. ?(0/1)   Deeper sections and a pancytokeratin immunostain are negative in   support of the above diagnosis. ? ? ? ?     B. Lymph node (1), left sentinel lymph node #2, excision:   There is no evidence of malignancy in one lymph node. ?(0/1)   Deeper sections and a pancytokeratin immunostain are negative in   support of the above diagnosis. ?     C. Lymph node (1), left sentinel lymph node #3, excision:   There is no evidence of malignancy in one lymph node. ?(0/1)   Deeper sections and a pancytokeratin immunostain are negative in   support of the above diagnosis. ?     D. Breast, left breast lumpectomy, lumpectomy:   Invasive ductal carcinoma, histologic grade 2.   Prior biopsy site changes, radioactive seed and metallic clip   identified.   See synoptic report below.     E. Fibroadipose tissue, left superior margin, clip marks true margin,   excision:   Negative for malignancy. ? ?     F. Breast, left lateral margin, clip marks true margin, excision:   Negative for malignancy. ? ?     G. Fibroadipose tissue, left anterior margin, clip marks true margin,   excision:   Negative for malignancy. ? ?     H. Breast, left posterior margin, clip marks true margin, excision:   Negative for malignancy. ?     I. Breast, left medial margin, clip?marks true margin, excision:   Negative for malignancy.   ? ? ? ?     J. Breast, left inferior margin, clip marks true margin, excision:   Negative for malignancy. ?       Comment:   INVASIVE CARCINOMA OF THE BREAST: RESECTION   CAP Version: Invasive Breast 4.8.0.0   Procedure: Lumpectomy   Laterality: Left   Tumor Site: Upper outer quadrant   Histologic Type: Invasive ductal carcinoma, NOS   Histologic Grade (Nottingham Histologic Score): II/III   Tubular Differentiation: 3   Nuclear Grade: 2   Mitotic Rate (40x objective): 1   Total Nottingham Score: 6/9   Size of Invasive Component: 1.5 x 1.2 x 0.8 cm     Surgical Margins:   Lumpectomy   Main lumpectomy specimen:   Invasive carcinoma: The closest margin is 1 mm from lateral margin.   All remaining margins are greater than 2 mm.   DCIS: N/a     Final shave margins:   All margins are negative for invasive and in-situ carcinoma.     Ductal Carcinoma  In-situ (DCIS): Absent   Extensive intraductal component (>25%): Absent   DCIS within and/or adjacent to invasive carcinoma: No   DCIS separate from invasive carcinoma: No ?   Lobular Carcinoma In-situ (LCIS): Absent   Lymphatic and / or Vascular Invasion: Not identified   Nipple Involvement: Not applicable   Skin Involvement: Not applicable   Skeletal Muscle: Not present   Regional Lymph Node Sampling:   Sentinel lymph node(s) only   Total Number of Lymph Nodes Examined: 3   Number of Sentinel Nodes Examined: 3   Number of lymph nodes with macrometastases (greater than 2 mm):   0   Number of lymph nodes with micrometastases (>0.2 mm to 2 mm   and/or greater than 200 cells): 0   Number of lymph nodes with isolated tumor cells (0.2 mm or less   or 200 cells or less): 0   Size of largest metastatic deposit (millimeters): N/a   Extranodal extension: Not applicable   Treatment Effect in the Breast: No known presurgical therapy   Distant Site(s) Involved, if applicable: Not applicable   Biomarker Testing: Ordered. The results will be issued as an addendum.   Time between tumor removal and placement into formalin < 1 hour: Yes   Fixation Time between 6-72 hours: Yes   Pathologic Staging (pTNM, AJCC 8th edition): ?pT1c ? snN0 ?Mn/a     TNM Descriptors (select all that apply)   Not applicable     Primary Tumor (Invasive Carcinoma) (pT)   pT1c: ? Tumor greater than 10 mm but less than or equal to 20 mm in   greatest dimension     Regional Lymph Nodes (pN)   (sn): ? ? Only sentinel node(s) evaluated. ?If 6 or more sentinel nodes   and/or nonsentinel nodes are removed, this modifier should ? ? ?not be   used.   pN0: ?No regional lymph node metastasis identified or ITCs only     Distant Metastasis (pM) (required only if confirmed pathologically)   Not applicable: ?pM cannot be determined from the submitted specimen(s)     The pathologic stage assigned here should be regarded as provisional, as   it reflects only current pathologic data and does not incorporate full   knowledge of the patient's clinical status and/or prior pathology.     Block for Biomarker Testing: ? D6       ASSESSMENT/PLAN:  S/P: 12 days s/p left lumpectomy/SLNB for left IDC    The pathology was reviewed with Ms. Chheng and her daughter and she was given a copy of the results. We performed some massage of the breast and caused the hematoma to break up some which caused the contour of the breast to improve. We will give her a foam insert to use to help with compression of the breast. We attempted aspiration of the axillary seroma/hematoma today, but were unable to express fluid. We would like her to apply compression and we will schedule an aspiration in radiology. If the compression helps and the hematoma gets smaller, we can cancel the aspiration. Ms. Naillon follows with Dr. Lysle Morales for medical oncology and has an appointment this week. We will refer her to Dr. Izola Price for radiation consultation. Ms. Kohlman will have lymphedema education today and a BIS in 9 months. She will follow up in the APP clinic in 9 months with a bilateral diagnostic mammogram. If everything is looking good, then she can continue Survivorship care with Dr. Lysle Morales. She and her daughter were given ample  time to ask questions all of which were answered to their satisfaction by Dr. Loreta Ave. She was encouraged to call or utilize MyChart with any interval questions or concerns.    1. Continue follow up with Dr. Lysle Morales  2. Lymphedema education today  3. Compression for seroma and aspiration schedule  4. Refer to Dr. Izola Price in [place] for radiation discussion  5. BIS in 9 months  6. Follow up in APP clinic in 9 months with bilateral diagnostic mammogram    Guy Begin, PA-C

## 2022-05-08 ENCOUNTER — Encounter: Admit: 2022-05-08 | Discharge: 2022-05-08 | Payer: MEDICARE

## 2022-06-14 ENCOUNTER — Encounter: Admit: 2022-06-14 | Discharge: 2022-06-14 | Payer: MEDICARE

## 2022-08-21 ENCOUNTER — Encounter: Admit: 2022-08-21 | Discharge: 2022-08-21 | Payer: MEDICARE

## 2022-12-18 ENCOUNTER — Encounter: Admit: 2022-12-18 | Discharge: 2022-12-18 | Payer: MEDICARE

## 2023-01-08 ENCOUNTER — Encounter: Admit: 2023-01-08 | Discharge: 2023-01-08 | Payer: MEDICARE

## 2023-01-08 ENCOUNTER — Ambulatory Visit: Admit: 2023-01-08 | Discharge: 2023-01-08 | Payer: MEDICARE

## 2023-01-08 DIAGNOSIS — Z9189 Other specified personal risk factors, not elsewhere classified: Secondary | ICD-10-CM

## 2023-01-08 DIAGNOSIS — I10 Essential (primary) hypertension: Secondary | ICD-10-CM

## 2023-01-08 DIAGNOSIS — C50212 Malignant neoplasm of upper-inner quadrant of left female breast: Secondary | ICD-10-CM

## 2023-01-08 DIAGNOSIS — I519 Heart disease, unspecified: Secondary | ICD-10-CM

## 2023-01-08 DIAGNOSIS — M1990 Unspecified osteoarthritis, unspecified site: Secondary | ICD-10-CM

## 2023-01-08 DIAGNOSIS — M549 Dorsalgia, unspecified: Secondary | ICD-10-CM

## 2023-01-08 DIAGNOSIS — C50919 Malignant neoplasm of unspecified site of unspecified female breast: Secondary | ICD-10-CM

## 2023-01-08 DIAGNOSIS — Z08 Encounter for follow-up examination after completed treatment for malignant neoplasm: Secondary | ICD-10-CM

## 2023-01-08 DIAGNOSIS — I4891 Unspecified atrial fibrillation: Secondary | ICD-10-CM

## 2023-01-08 NOTE — Progress Notes
Lymphedema Prevention Bioimpedance Spectroscopy (BIS) Monitoring    Reviewed BIS testing results from today.  Results:     Current: -0.1  Baseline: -3.8  Change from Baseline: 2.8    WNL less than 3 standard deviation increase from baseline.      Notified patient via MyChart result was normal and to continue with routine follow up as scheduled. Provided clinic contact information for any questions or concerns.

## 2023-01-08 NOTE — Progress Notes
Bioimpedance Spectroscopy performed.  Advised patient that additional information will be sent via Mychart (preferred) or phone if indicated by the lymphedema nurse within 24 hours.

## 2023-01-08 NOTE — Progress Notes
Name: Misty Clay          MRN: [id number]      DOB: October 12, 1933      AGE: 89 y.o.   DATE OF SERVICE: 01/08/2023               Reason for Visit:  Heme/Onc Care      Misty Clay is a 89 y.o. female.      Cancer Staging   Malignant neoplasm of upper-inner quadrant of left breast in female, estrogen receptor positive  (HCC)  Staging form: Breast, AJCC 8th Edition  - Clinical stage from 01/24/2022: Stage IA (cT1c, cN0, cM0, G2, ER+, PR+, HER2-) - Signed by Guy Begin, PA-C on 02/15/2022  - Pathologic stage from 03/15/2022: Stage IA (pT1c, pN0(sn), cM0, G2, ER+, PR+, HER2-) - Signed by Guy Begin, PA-C on 03/22/2022    DIAGNOSIS:  Left grade 2 IDC (ER99%, PR90%, HER2 1+, Ki-67 17%) at 9:00, dx 12/2021    History of Present Illness    Misty Clay returns to the clinic for 10 month follow up of left breast cancer.     HISTORY:  Misty Clay is a Caucasian female who presented to the [facility] on 02/15/2022 at age 69 for evaluation of left breast cancer. Misty Clay had no complaints prior to her outside screening mammogram on 12/25/21. At that time, there was a new left breast density. She returned for left diagnostic mammogram and ultrasound on 01/04/22. The mass persisted and was mildly suspicious on ultrasound and biopsy was recommended. Left breast sono-guided biopsy 01/24/22 ([place]) revealed grade 2 invasive ductal carcinoma. On imaging at [facility], there was a mildly suspicious group of calcifications in the upper outer quadrant of the left breast. Stereotactic biopsy was recommended. Left stereotactic biopsy 02/23/22 ([place]) revealed Atypical ductal hyperplasia. She was presented at [facility] on 03/06/22 and surgical excision was not recommended due to age, but a 6-9 month follow up diagnostic mammogram was recommended. Misty Clay underwent left RSL lumpectomy/SLNB on 03/15/22.    PATHOLOGY:  Tumor:  1.5 cm IDC  Margins Free From Tumor:  Yes  ER:  positive   PR: positive    Her 2:  negative  Grade:  2  Lymph Nodes:  0/3  LVSI:  no  Extranodal extension:  no      BREAST IMAGING:  Mammogram:    -- Bilateral screening mammogram 12/25/21 ([place]) revealed heterogeneously dense breast tissue. There is a new density in the medial aspect of the left breast best seen on the CC view. This appears to be just below the nipple line on the MLO view. Additional views are recommended. Right breast is unremarkable. There are scattered benign calcifications.  -- Left diagnostic mammogram 01/04/22 ([place]) revealed compression and rolled views of this area show that this finding still seems to persist. This area is difficult to identify on the true lateral view. It is unclear whether this finding is related to superimposition or to an underlying abnormality. I would recommend ultrasound.  -- Bilateral diagnostic mammogram 02/15/22 ([place]) revealed scattered areas of fibroglandular density.  History: 89 year old female with recently diagnosed left breast malignancy at an external facility. 2-D and 3-D images of the right breast were performed, including 3-D spot compression views. Spot magnification views were obtained of the left breast. Right: The questioned asymmetry in the central posterior right breast on CC view does not persist on additional views. Left: There are loosely grouped faint and coarse  heterogeneous calcifications in the upper outer quadrant at 2:00, far posterior depth, measuring up to 4 cm.     Ultrasound:    -- Targeted left breast ultrasound 01/04/22 ([place]) revealed at 9:00, 5 cm FTN, there is a 5 x 2 x 3 mm avascular hypoechoic area. This hypoechoic area is surrounded by an isoechoic rind which makes the entire region apprxoimately 1 cm in size. I am suspicious that this does correspond to the finding of the mammogram. There does not appear to be any significant vascularity associated with this finding that would suggest it is an aggressive neoplastic process. Even so, the possibility that this is neoplastic in nature should still be considered. Ultrasound biopsy recommended. BI-RADS 4.  -- Targeted left breast ultrasound 02/15/22 ([place]) revealed a 1.0 x 0.8 x 1.3 cm irregular hypoechoic mass with indistinct margins in the left breast at the 9:00, 5 cm from the nipple consistent with biopsy-proven malignancy. Tissue marker clip is at the deep/posterior aspect. No morphologically abnormal left internal mammary lymph nodes are identified. 5 morphologically normal-appearing left axillary lymph nodes are documented. Impression: 1. Biopsy-proven malignancy in the left breast at 9:00 measuring up to 1.3 cm. 2. Mildly suspicious calcifications in the upper outer left breast. If breast conservation therapy is desired, stereotactic guided biopsy would be recommended.      REPRODUCTIVE HEALTH:  Age at first Menarche:  61  Age at First Live Birth:  47  Age at Menopause:  S/p hysterectomy at 59, took HRT until 70s  Gravida:  2  Para: 2    PROCEDURE: Left RSL lumpectomy/SLNB, 03/15/22  PERTINENT PMH:  Atrial fibrillation, HTN  FAMILY HISTORY:  No known family history of breast, ovarian, prostate or pancreatic cancer  PHYSICAL EXAM on PRESENTATION:  Left - Oval mass at 9:00 most likely post biopsy hematoma measuring 2.5 cm 3 cm from the border of the areola, 3 cm area of skin ecchymosis lying 1 cm below the palpable hematoma, skin pulling diagonally just below the hematoma in the seated flexed position.  No nipple changes.  No skin erythema. Right - No palpable breast masses. No skin, nipple, or areolar change. No supraclavicular or axillary adenopathy.  MEDICAL ONCOLOGY:   Dr. Lysle Morales   REFERRED BY:  Dr. Lysle Morales       Review of Systems    No Known Allergies    Medical History:   Diagnosis Date    Arthritis     Atrial fibrillation (HCC)     Back pain     Breast cancer (HCC) 01/2022    Heart disease     Hypertension      Surgical History:   Procedure Laterality Date    HX HYSTERECTOMY  1986 CATARACT REMOVAL Bilateral 2021    Left Radioactive Seed Localized Lumpectomy, Intraoperative Lymphatic Mapping, Sentinel Lymph Node Biopsy Left 03/15/2022    Performed by Neldon Mc, DO at [facility]    IDENTIFICATION SENTINEL LYMPH NODE Left 03/15/2022    Performed by Neldon Mc, DO at [facility]    INJECTION RADIOACTIVE TRACER FOR SENTINEL NODE IDENTIFICATION Left 03/15/2022    Performed by Neldon Mc, DO at [facility] OR    Left Sentinel Lymph Node Biopsy Left 03/15/2022    Performed by Neldon Mc, DO at [facility]    ANGIOPLASTY      early 2000's    BREAST BIOPSY      COLONOSCOPY       Family History   Problem  Relation Age of Onset    Cancer Mother     Heart Disease Father     Heart Disease Maternal Aunt      Social History     Socioeconomic History    Marital status: Widowed   Tobacco Use    Smoking status: Former     Current packs/day: 0.00     Average packs/day: 0.5 packs/day for 15.0 years (7.5 ttl pk-yrs)     Types: Cigarettes     Start date: 29     Quit date: 60     Years since quitting: 51.2    Smokeless tobacco: Never   Vaping Use    Vaping status: Never Used   Substance and Sexual Activity    Alcohol use: Not Currently    Drug use: Never     Vaping/E-liquid Use    Vaping Use Never User                   Objective:          calcium carbonate/vitamin D3 (CALTRATE 600 + D PO) Take  by mouth.    famotidine (PEPCID) 20 mg tablet Take one tablet by mouth at bedtime daily.    FEROSUL 325 mg (65 mg iron) tablet Take one tablet by mouth daily.    Glucosamine-Chondroitin (OSTEO BI-FLEX) 250-200 mg tab Take 1 tablet by mouth twice daily.    hydroCHLOROthiazide 12.5 mg capsule Take one capsule by mouth daily.    letrozole [facility]) 2.5 mg tablet Take one tablet by mouth daily.    losartan (COZAAR) 25 mg tablet Take one tablet by mouth daily.    metoprolol succinate XL (TOPROL XL) 50 mg extended release tablet Take one tablet by mouth twice daily.    XARELTO 20 mg tablet Take one tablet by mouth daily.     Vitals: 01/08/23 1600   BP: (!) 152/71   BP Source: Arm, Right Upper   Pulse: 68   Temp: 36.6 ?C (97.8 ?F)   Resp: 12   SpO2: 97%   TempSrc: Temporal   PainSc: Zero   Weight: 61.7 kg (136 lb)   Height: 162.6 cm (5' 4)     Body mass index is 23.34 kg/m?Marland Kitchen     Pain Score: Zero            Pain Addressed:  N/A    Patient Evaluated for a Clinical Trial: No treatment clinical trial available for this patient.     [place] Cooperative Oncology Group performance status is 1, Restricted in physically strenuous activity but ambulatory and able to carry out work of a light or sedentary nature, e.g., light house work, office work.     Physical Exam  Vitals reviewed.     RIGHT BREAST EXAM:  Breast:  No palpable masses  Skin Erythema:  No  Attachment of Overlying Skin:  No  Peau d' orange:  No  Chest Wall Attachment:  No  Nipple Inversion:  No  Nipple Discharge: No    LEFT BREAST EXAM:  Breast: Well healed lumpectomy incision. No palpable masses  Skin Erythema:  No  Attachment of Overlying Skin:  No  Peau d' orange:  No  Chest Wall Attachment: No  Nipple Inversion:  No  Nipple Discharge:  No    RIGHT NODAL BASIN EXAM:  Axillary:  negative  Infraclavicular:  negative  Supraclavicular:  negative    LEFT NODAL BASIN EXAM:  Axillary:  negative  Infraclavicular: negative  Supraclavicular:  negative  Constitutional: No acute distress.  HEENT:  Head: Normocephalic and atraumatic.  Eyes: No discharge. No scleral icterus.  Pulmonary/Chest: No respiratory distress.   Neurological: Alert and oriented to person, place and time. No cranial nerve deficit.  Skin: Warm and dry. No rash noted. No erythema. No pallor.  Psychiatric: Normal mood and affect. Behavior is normal. Judgement and thought content normal.            Assessment and Plan:  Left grade 2 IDC (ER99%, PR90%, HER2 1+, Ki-67 17%) at 9:00, dx 12/2021 - NED    Ms. Mirando reports she is doing well. She denies any changes in her medical or family history. She is following with Dr. Lysle Morales in [place], [place] for medical oncology. She is on letrozole and is tolerating. She had a BIS today for arm lymphedema surveillance and she does not need to continue BIS. She had bilateral diagnostic mammogram today which showed no abnormalities. We do not need to continue yearly screening imaging. Ms. Minjares can get imaging based on clinical exam only. Her and her daughter report that she has not been getting exams when she has been seen in Dr. Carey Bullocks office or by her PCP. Since she is not going to be following with [place] anymore, I will reach out to make sure she is getting exams when she sees Dr. Lysle Morales. She will continue Survivorship care with Dr. Lysle Morales and may return to the clinic as needed. She was given ample time to ask questions all of which were answered to her satisfaction. She was given direct contact information and encouraged to call or utilize MyChart with any interval questions or concerns.    Continue follow up with Dr. Lysle Morales  RTC PRN  Discontinue routine mammograms    Guy Begin, PA-C

## 2023-01-16 ENCOUNTER — Encounter: Admit: 2023-01-16 | Discharge: 2023-01-16 | Payer: MEDICARE

## 2023-01-16 NOTE — Progress Notes
I left a message for Dr. Melida Gimenez nurse letting her know that we are no longer following with Ms. Vialpando so they can continue her breast exams and we are not recommend further screening mammogram.    Mina Marble, PA-C

## 2023-06-29 ENCOUNTER — Encounter: Admit: 2023-06-29 | Discharge: 2023-06-29 | Payer: MEDICARE

## 2023-10-28 IMAGING — US US BRST BX 1[PERSON_NAME] LT W/G
1 series · 6 of 6 positions shown · non-contrast
Comparison: none

NAME:   SHAB  

US BRST BX 1LARS OTTO MARASHLI LT W/G
INDICATION: Left breast nodule. Patient presents for

[Series 1: us brst bx 1(person_name) left w/g · 6 of 6 slices shown]
[im 1/6]
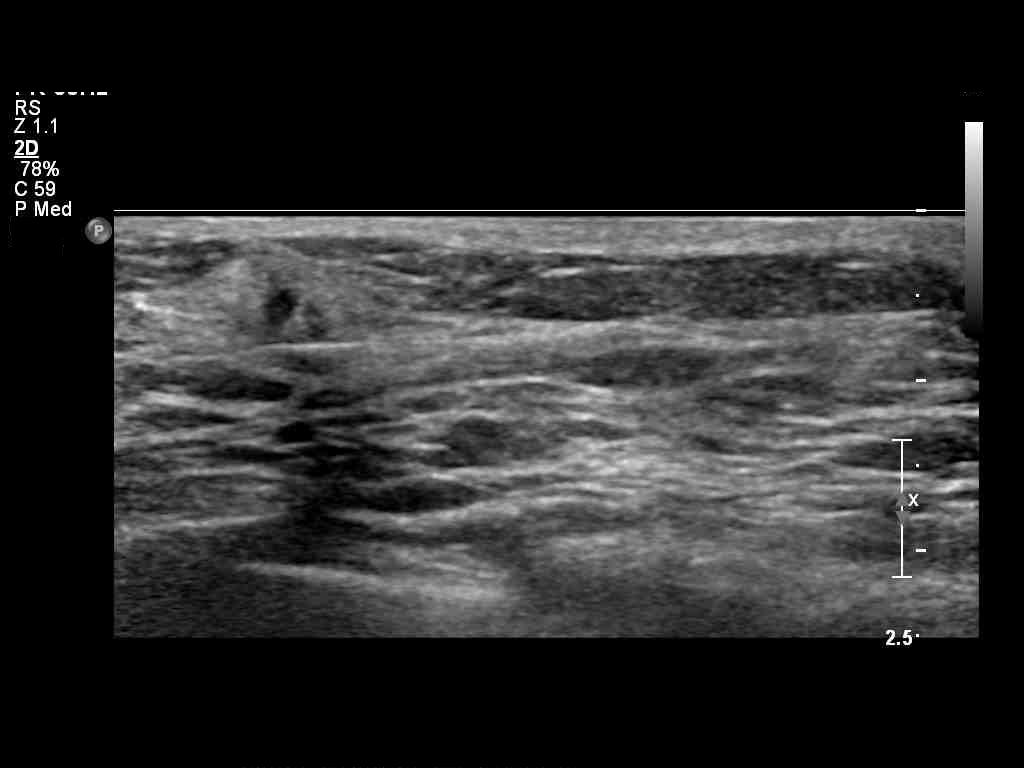
[im 2/6]
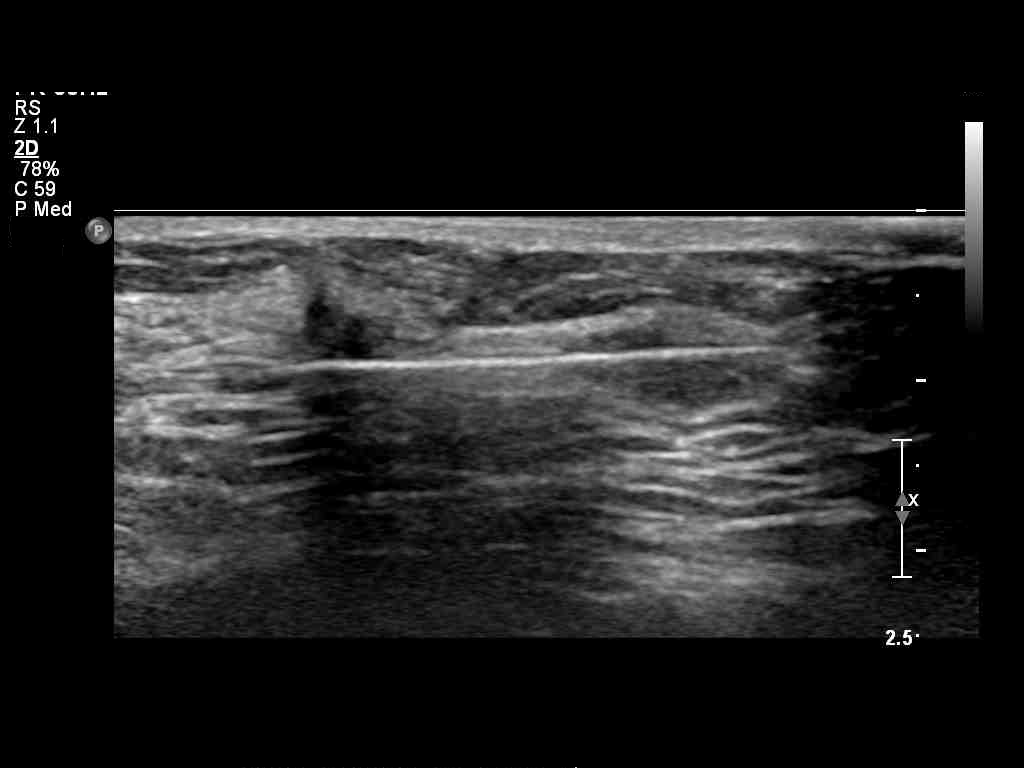
[im 3/6]
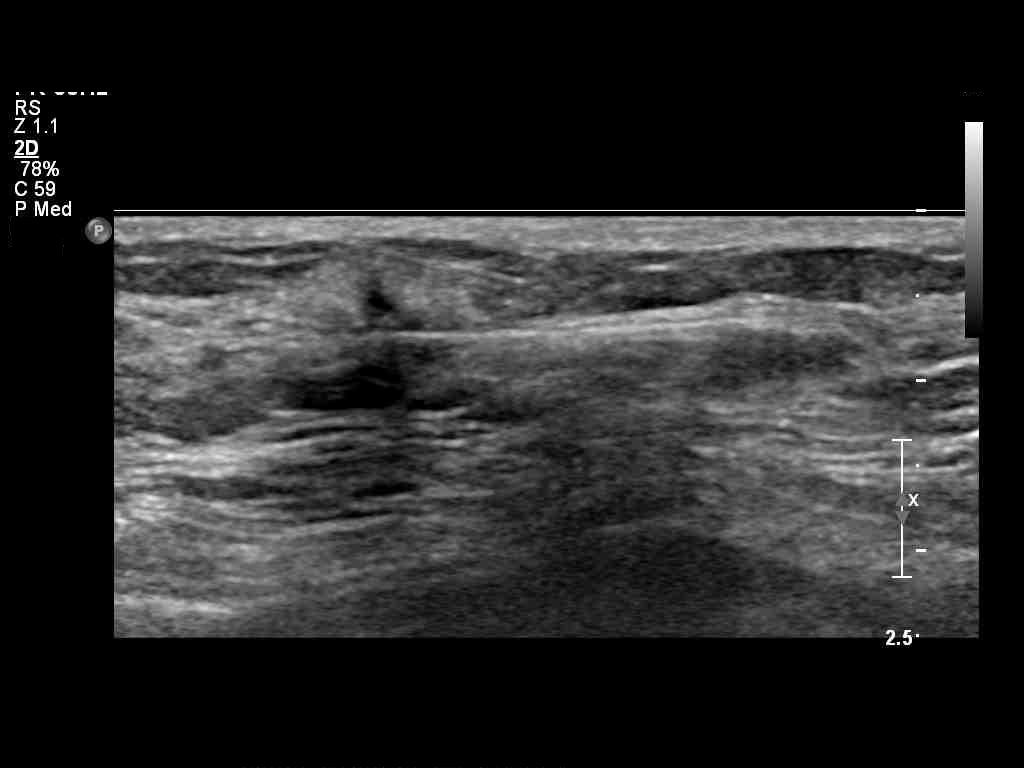
[im 4/6]
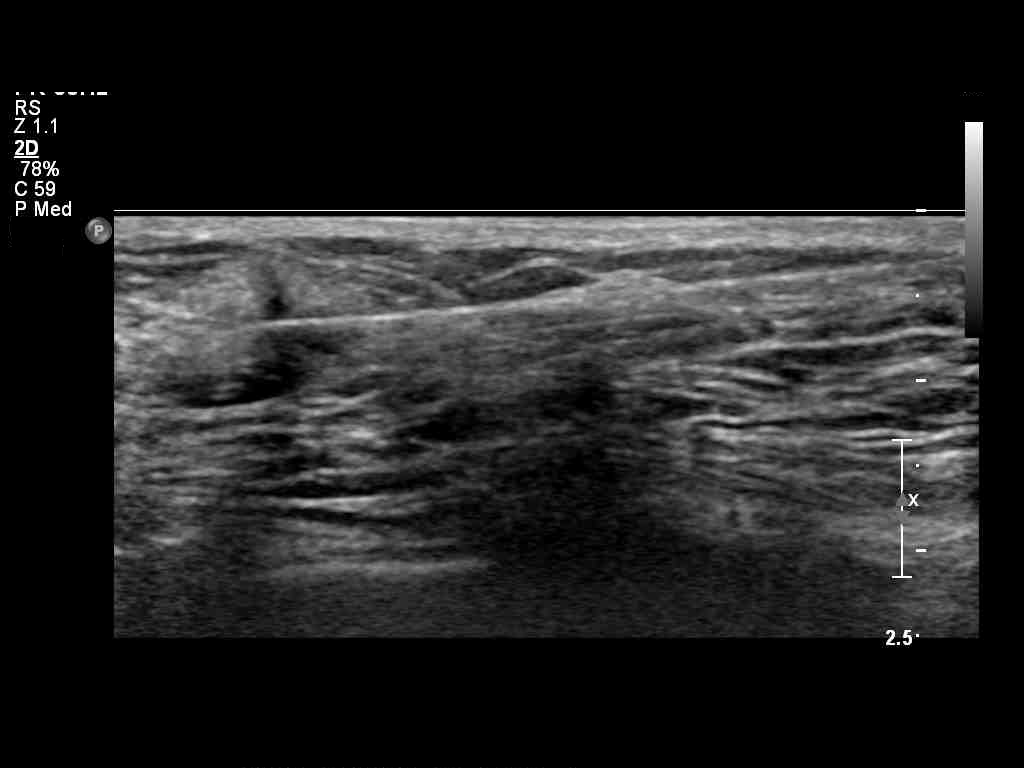
[im 5/6]
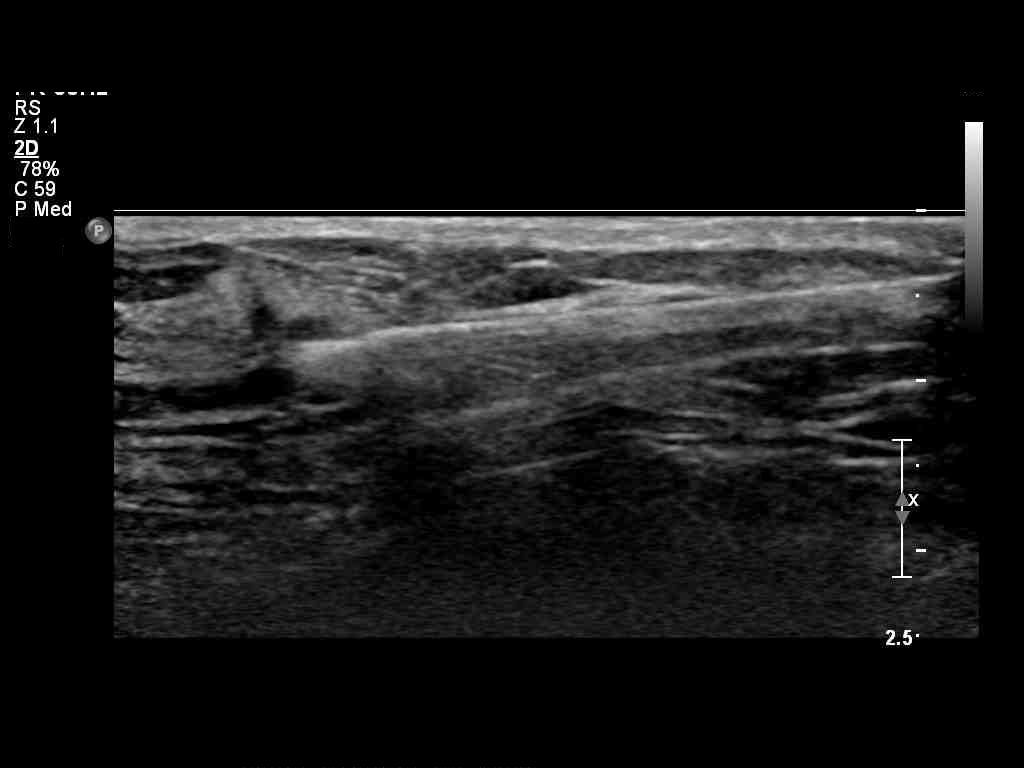
[im 6/6]
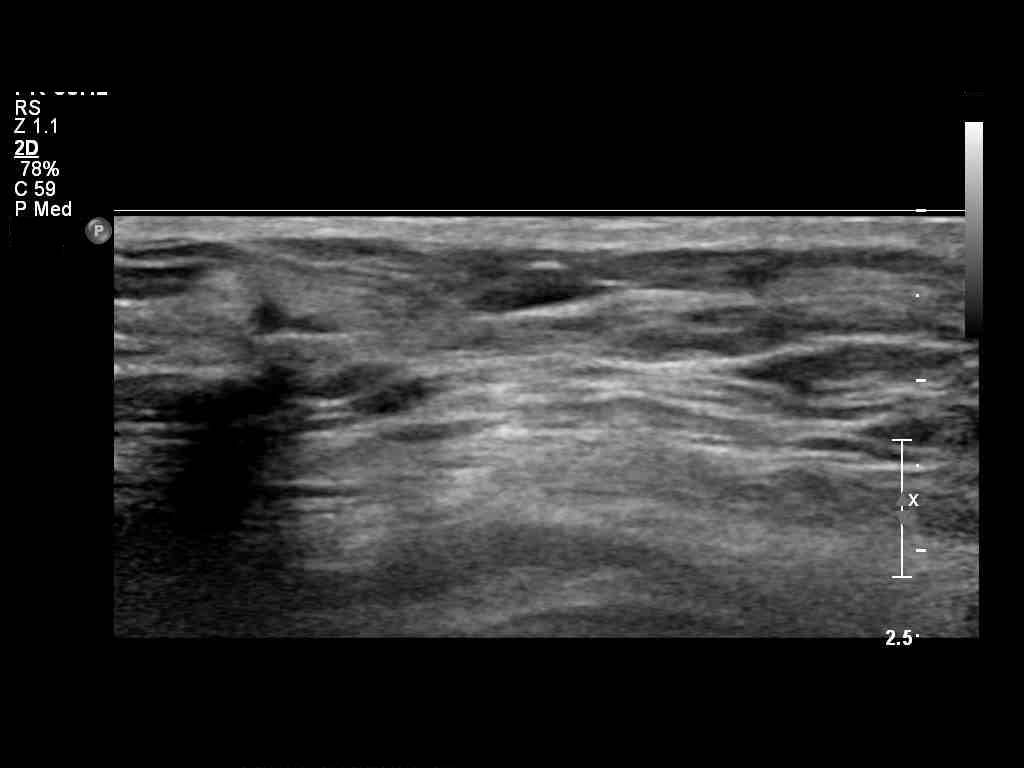

[6 of 6 positions shown; findings below may reference images not displayed]

ultrasound-guided biopsy.  

 Patient brought to the sonographic suite placed in the supine  

 position. Ultrasound imaging of the left breast was performed to  

 evaluate appropriate entry site. Left breast was then prepped and  

 draped in usual sterile fashion. Small amount 1% lidocaine was  

 utilized for local anesthesia. A total of 4 core biopsies were  

 obtained through the irregular hypoechoic nodule at the [DATE]  

 location of the left breast utilizing a 14-gauge achieve needle.  

 A marker clip was then deployed. Hemostasis was obtained using  

 manual compression. Patient tolerated the procedure well and was  

 sent for post procedure mammogram in satisfactory condition.
IMPRESSION: Successful ultrasound-guided core biopsy of the  

 hypoechoic irregular nodule at the [DATE] location left breast.  

 Pathology results are currently pending.  

DANTIER [phone]  

********************ADDENDUM REPORT********************  

ADDENDUM  /
IMPRESSION: Pathology results from recent left breast biopsy indicate  

 infiltrating ductal carcinoma. Oncologic and surgical  

 consultation is recommended.

## 2023-10-28 IMAGING — MG 3D MAMMO LEFT DIAGNOSITC
2 series · 2 of 2 positions shown · non-contrast
Comparison: none

NAME:   URI  

3D MAMMO LEFT DIAGNOSITC
INDICATION: Left breast density, status post ultrasound guided

[L ML]
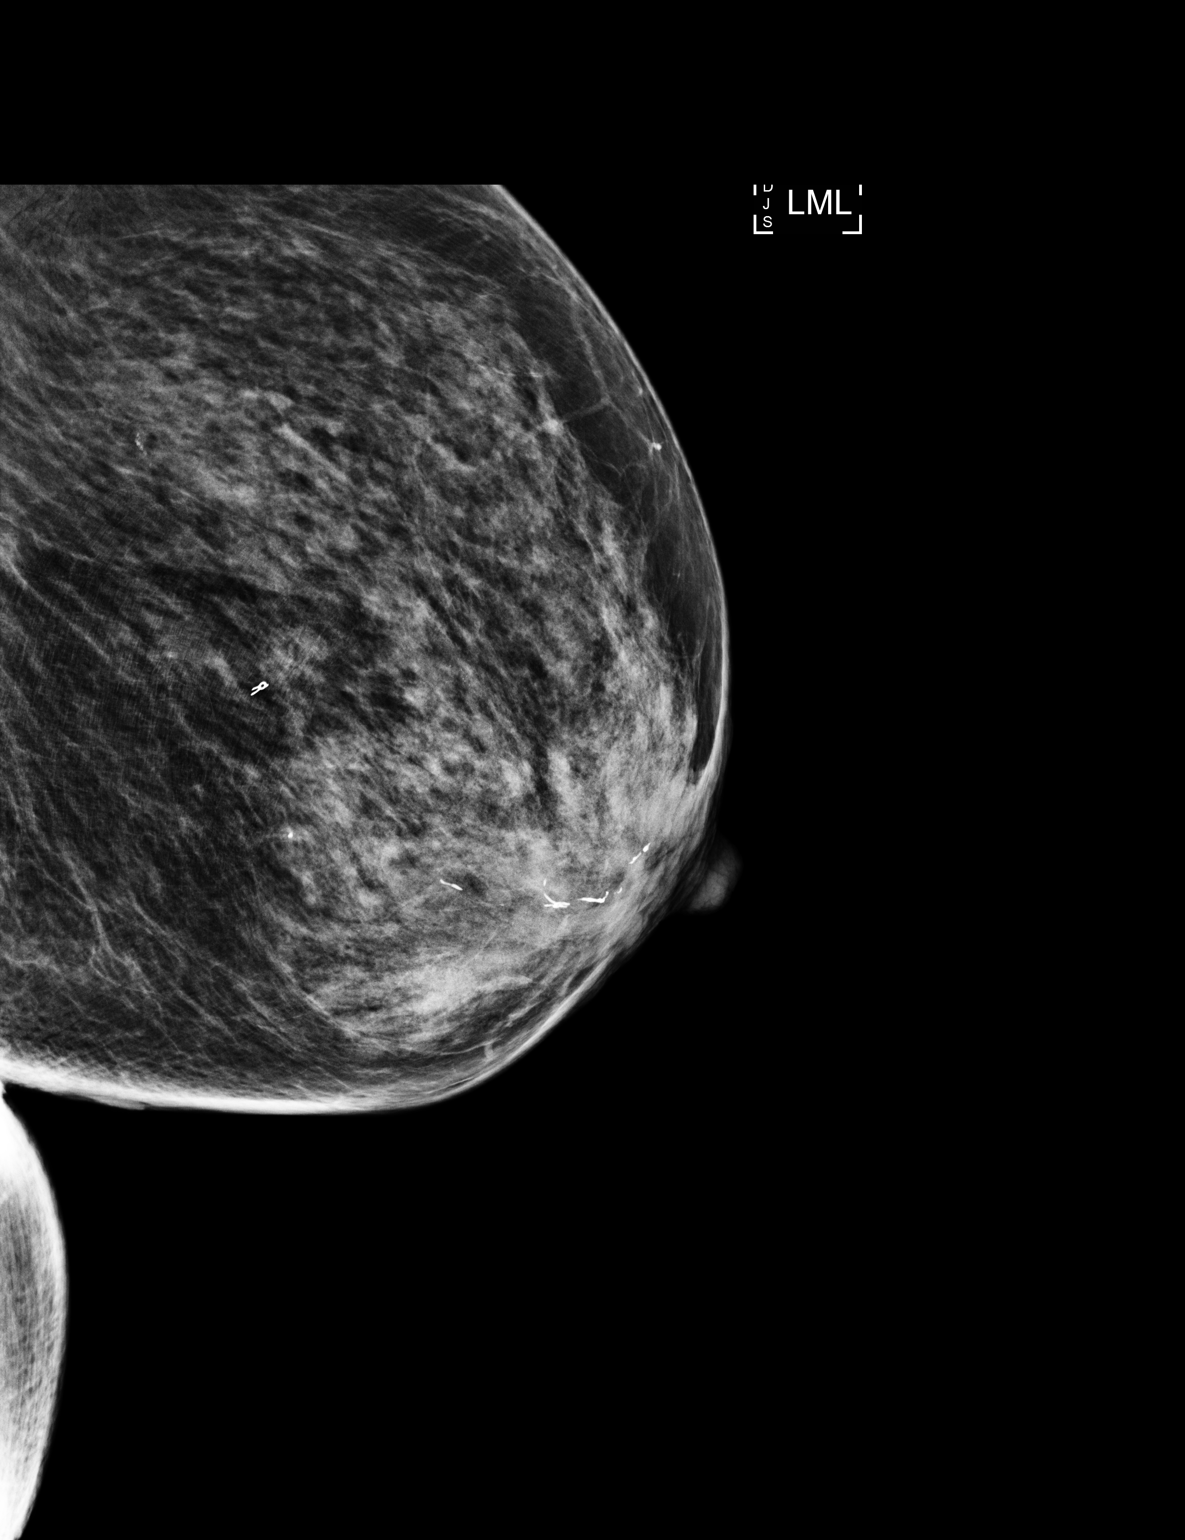

[L CC]
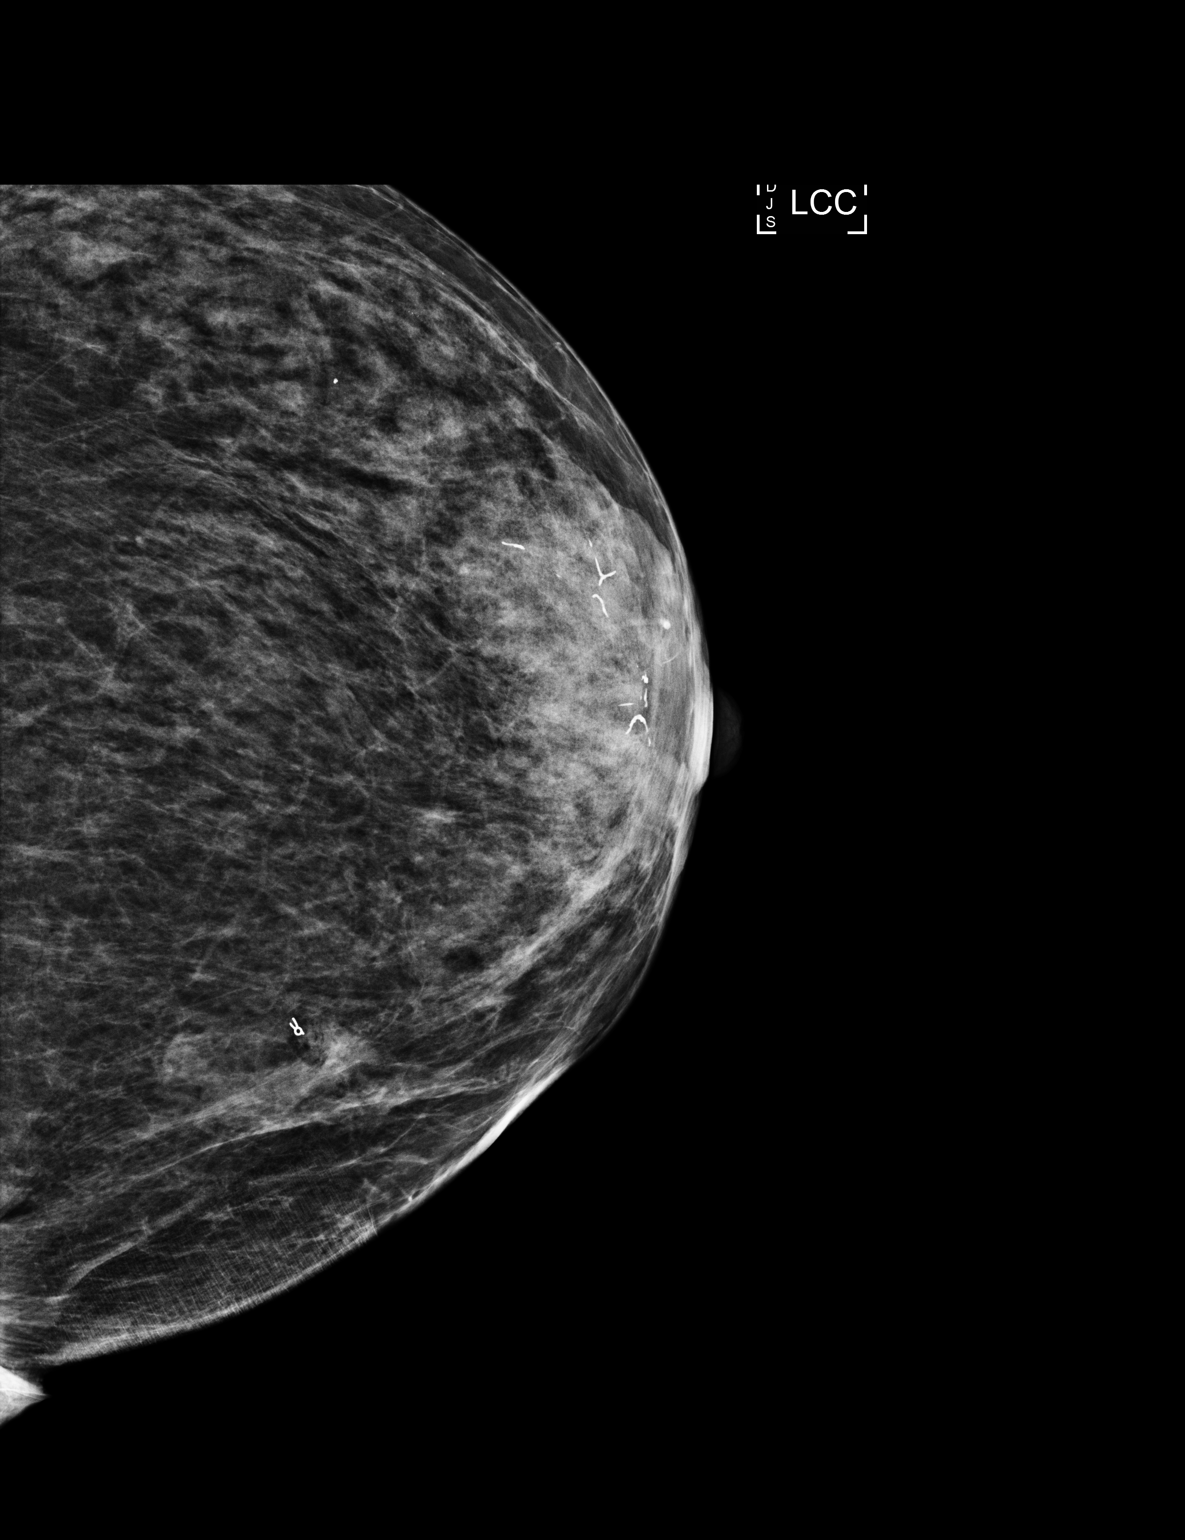

[2 of 2 positions shown; findings below may reference images not displayed]

biopsy. Study is performed to evaluate clip placement.  

 Unilateral left 2-D cc and ML mammography was performed after  

 patient underwent left breast ultrasound-guided biopsy and clip  

 placement.  

 Left breast is heterogeneously dense, limiting sensitivity of  

 mammography. There is a marker clip in the medial aspect of the  

 left breast at mid depth near the area of previously described  

 density.
IMPRESSION: Marker clip placement, status post ultrasound-guided  

 left breast biopsy.  

NAZANIN [id number]

## 2023-12-11 ENCOUNTER — Encounter: Admit: 2023-12-11 | Discharge: 2023-12-11 | Payer: MEDICARE

## 2024-09-14 ENCOUNTER — Encounter: Admit: 2024-09-14 | Discharge: 2024-09-14 | Payer: MEDICARE

## 2024-09-28 ENCOUNTER — Encounter: Admit: 2024-09-28 | Discharge: 2024-09-28 | Payer: MEDICARE
